# Patient Record
Sex: Male | Born: 1975 | Hispanic: No | Marital: Married | Smoking: Never smoker
Health system: Southern US, Community
[De-identification: ages and names within clinical notes are randomized; demographics above are authoritative.]

---

## 2016-05-01 ENCOUNTER — Emergency Department (HOSPITAL_BASED_OUTPATIENT_CLINIC_OR_DEPARTMENT_OTHER)
Admission: EM | Admit: 2016-05-01 | Discharge: 2016-05-01 | Disposition: A | Discharge status: Home / Self Care | Payer: BLUE CROSS/BLUE SHIELD | Attending: Emergency Medicine | Admitting: Emergency Medicine

## 2016-05-01 ENCOUNTER — Emergency Department (HOSPITAL_BASED_OUTPATIENT_CLINIC_OR_DEPARTMENT_OTHER): Payer: BLUE CROSS/BLUE SHIELD

## 2016-05-01 ENCOUNTER — Encounter (HOSPITAL_BASED_OUTPATIENT_CLINIC_OR_DEPARTMENT_OTHER): Payer: Self-pay

## 2016-05-01 DIAGNOSIS — W19XXXA Unspecified fall, initial encounter: Secondary | ICD-10-CM | POA: Insufficient documentation

## 2016-05-01 DIAGNOSIS — S60221A Contusion of right hand, initial encounter: Secondary | ICD-10-CM | POA: Diagnosis not present

## 2016-05-01 DIAGNOSIS — Y939 Activity, unspecified: Secondary | ICD-10-CM | POA: Insufficient documentation

## 2016-05-01 DIAGNOSIS — Y999 Unspecified external cause status: Secondary | ICD-10-CM | POA: Insufficient documentation

## 2016-05-01 DIAGNOSIS — Y929 Unspecified place or not applicable: Secondary | ICD-10-CM | POA: Diagnosis not present

## 2016-05-01 DIAGNOSIS — S6991XA Unspecified injury of right wrist, hand and finger(s), initial encounter: Secondary | ICD-10-CM | POA: Diagnosis present

## 2016-05-01 DIAGNOSIS — S6000XA Contusion of unspecified finger without damage to nail, initial encounter: Secondary | ICD-10-CM

## 2016-05-01 NOTE — Discharge Instructions (Signed)
Rest, Ice intermittently (in the first 24-48 hours), Gentle compression with an Ace wrap, and elevate (Limb above the level of the heart) °  °Take up to 800mg of ibuprofen (that is usually 4 over the counter pills)  3 times a day for 5 days. Take with food. ° °Please follow with your primary care doctor in the next 2 days for a check-up. They must obtain records for further management.  ° °Do not hesitate to return to the Emergency Department for any new, worsening or concerning symptoms.  ° °

## 2016-05-01 NOTE — ED Notes (Signed)
ED Provider at bedside. 

## 2016-05-01 NOTE — ED Provider Notes (Signed)
[facility] Note   CSN: [id number] Arrival date & time: 05/01/16  1208     History   Chief Complaint Chief Complaint  Patient presents with  . Hand Injury    HPI   Blood pressure 125/81, pulse 93, temperature 97.8 F (36.6 C), temperature source Oral, resp. rate 18, SpO2 100 %.  Joshua Herring is a 41 y.o. male complaining of right hand pain status post mechanical fall 2 weeks ago. He's been taking ibuprofen intermittently with little relief. He is right-hand-dominant. States pain is exacerbated by movement and palpation. No prior traumas or surgeries to the affected joint. He denies any wrist injury or pain. States that the pain is worst in between the second and third digits on the distal metacarpals.  History reviewed. No pertinent past medical history.  There are no active problems to display for this patient.   History reviewed. No pertinent surgical history.     Home Medications    Prior to Admission medications   Not on File    Family History No family history on file.  Social History Social History  Substance Use Topics  . Smoking status: Never Smoker  . Smokeless tobacco: Never Used  . Alcohol use No     Allergies   Patient has no known allergies.   Review of Systems Review of Systems  10 systems reviewed and found to be negative, except as noted in the HPI.   Physical Exam Updated Vital Signs BP 125/81 (BP Location: Left Arm)   Pulse 93   Temp 97.8 F (36.6 C) (Oral)   Resp 18   SpO2 100%   Physical Exam  Constitutional: He is oriented to person, place, and time. He appears well-developed and well-nourished. No distress.  HENT:  Head: Normocephalic and atraumatic.  Mouth/Throat: Oropharynx is clear and moist.  Eyes: Conjunctivae and EOM are normal. Pupils are equal, round, and reactive to light.  Neck: Normal range of motion.  Cardiovascular: Normal rate, regular rhythm and intact distal pulses.   Pulmonary/Chest:  Effort normal and breath sounds normal.  Abdominal: Soft. There is no tenderness.  Musculoskeletal: Normal range of motion. He exhibits tenderness. He exhibits no edema.  No deformity or ecchymoses or overlying skin changes for range of motion with grip strength 5 out of 5, full strength and sensation. He is tender to palpation along the dorsal aspect in between the second and third digit. No snuffbox tenderness to palpation.  Neurological: He is alert and oriented to person, place, and time.  Skin: He is not diaphoretic.  Psychiatric: He has a normal mood and affect.  Nursing note and vitals reviewed.    ED Treatments / Results  Labs (all labs ordered are listed, but only abnormal results are displayed) Labs Reviewed - No data to display  EKG  EKG Interpretation None       Radiology Dg Hand Complete Right  Result Date: 05/01/2016 CLINICAL DATA:  Bending injury 1 month ago with pain at the second MCP joint. EXAM: RIGHT HAND - COMPLETE 3+ VIEW COMPARISON:  None. FINDINGS: Alignment of the right hand is normal. Negative for a fracture or dislocation. Soft tissues are unremarkable. IMPRESSION: Negative. Electronically Signed   By: Richarda OverlieAdam  Henn M.D.   On: 05/01/2016 12:50    Procedures Procedures (including critical care time)  Medications Ordered in ED Medications - No data to display   Initial Impression / Assessment and Plan / ED Course  I have reviewed the triage vital  signs and the nursing notes.  Pertinent labs & imaging results that were available during my care of the patient were reviewed by me and considered in my medical decision making (see chart for details).     Vitals:   05/01/16 1225  BP: 125/81  Pulse: 93  Resp: 18  Temp: 97.8 F (36.6 C)  TempSrc: Oral  SpO2: 100%    Medications - No data to display  Joshua Herring is 41 y.o. male presenting with Right hand pain status post fall 2 weeks ago there is no wrist pain x-ray negative, physical exam with no  abnormality, advised him to follow closely with primary care rest, ice, compression elevation.  Evaluation does not show pathology that would require ongoing emergent intervention or inpatient treatment. Pt is hemodynamically stable and mentating appropriately. Discussed findings and plan with patient/guardian, who agrees with care plan. All questions answered. Return precautions discussed and outpatient follow up given.      Final Clinical Impressions(s) / ED Diagnoses   Final diagnoses:  Contusion of finger of right hand, unspecified finger, initial encounter    New Prescriptions New Prescriptions   No medications on file     Joshua Emery, PA-C 05/01/16 1330    [address] PA-C 05/01/16 1332    Laurence Spates, MD 05/01/16 [phone]

## 2016-05-01 NOTE — ED Triage Notes (Signed)
Pt states he fell 3-4 weeks ago-no break in skin/bruising or swelling noted-pain to right hand-NAD-steady gait

## 2016-05-01 NOTE — ED Notes (Signed)
Patient transported to X-ray 

## 2018-05-12 IMAGING — CR DG HAND COMPLETE 3+V*R*
3 series · 3 of 3 positions shown · non-contrast
Comparison: None.

CLINICAL DATA: Bending injury 1 month ago with pain at the second
MCP joint.

EXAM:
RIGHT HAND - COMPLETE 3+ VIEW

[x hand pa right]
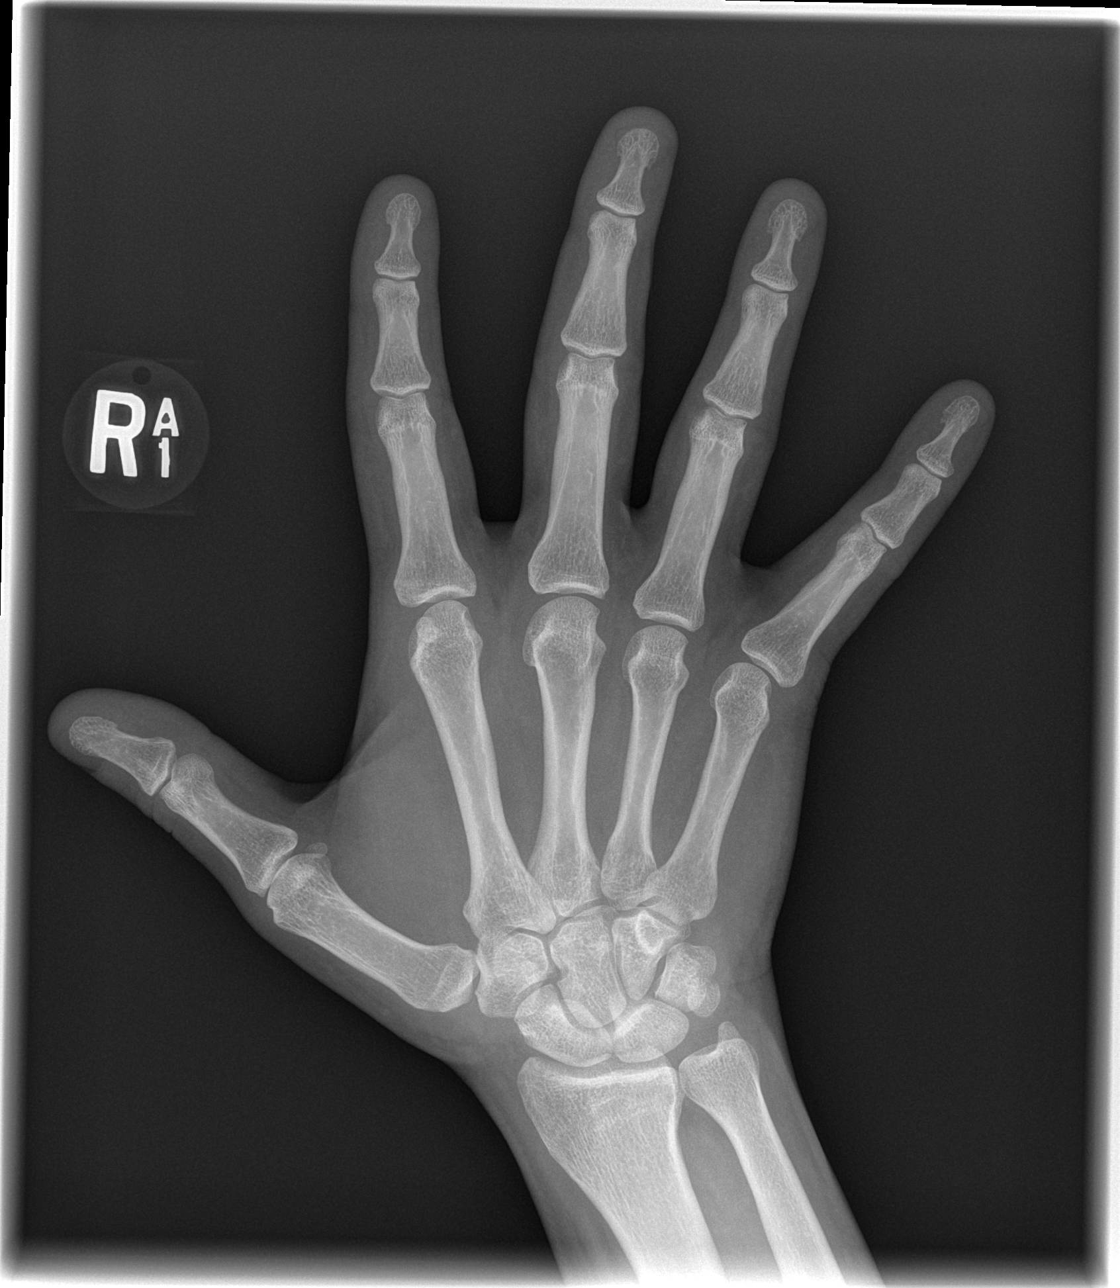

[x hand oblique right]
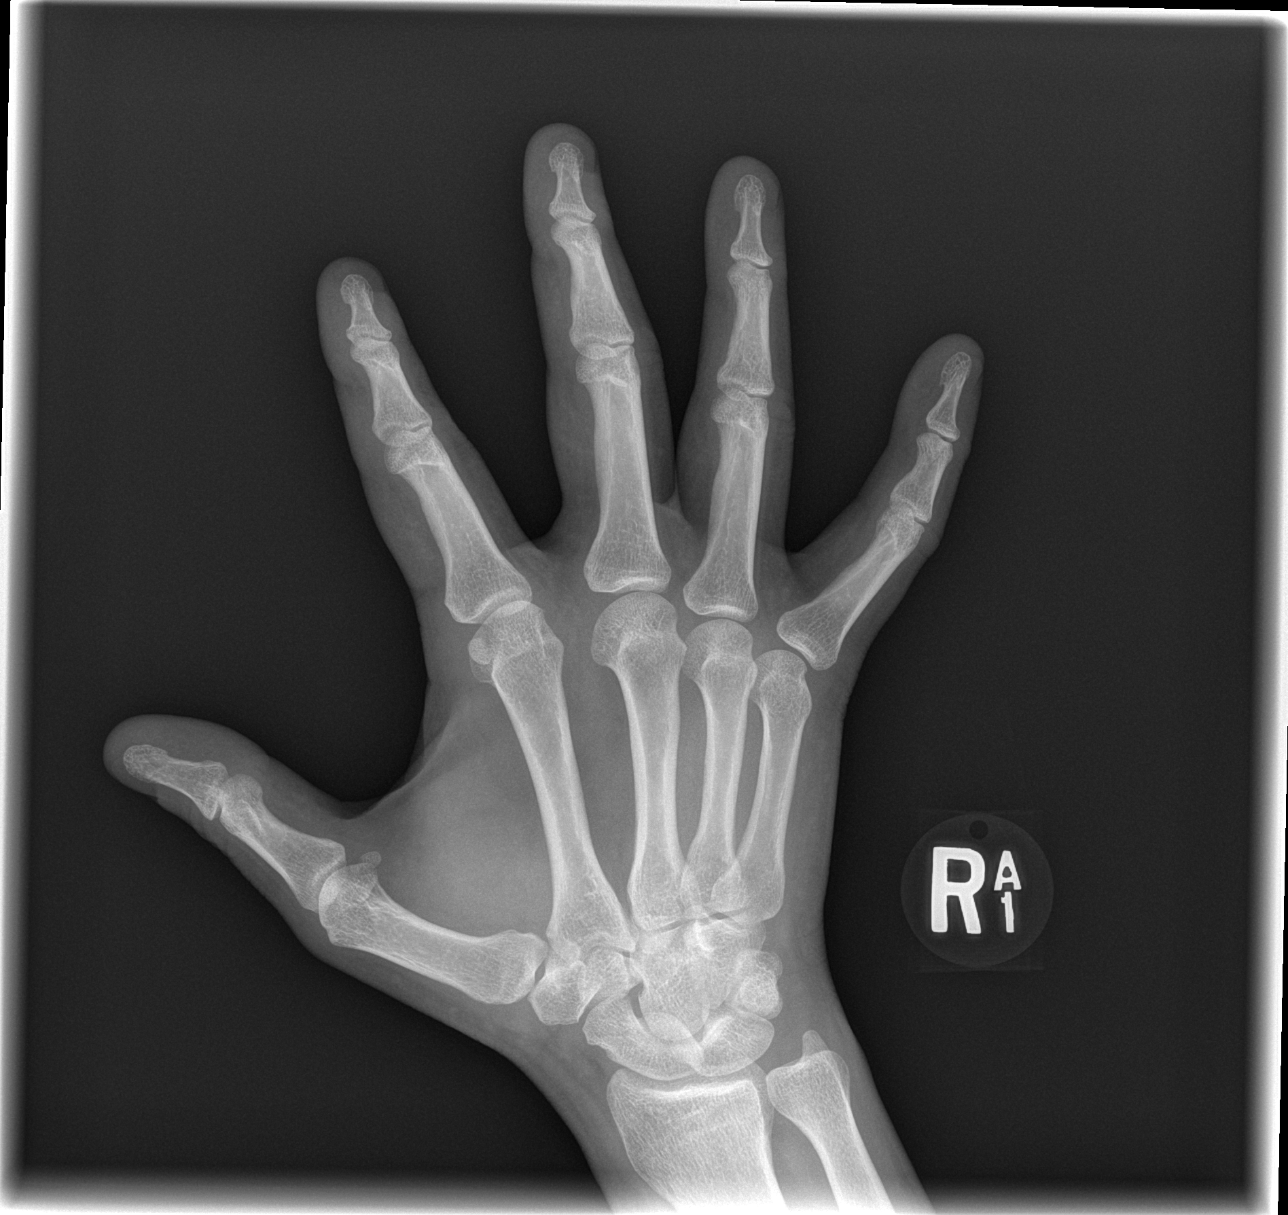

[x hand lat right]
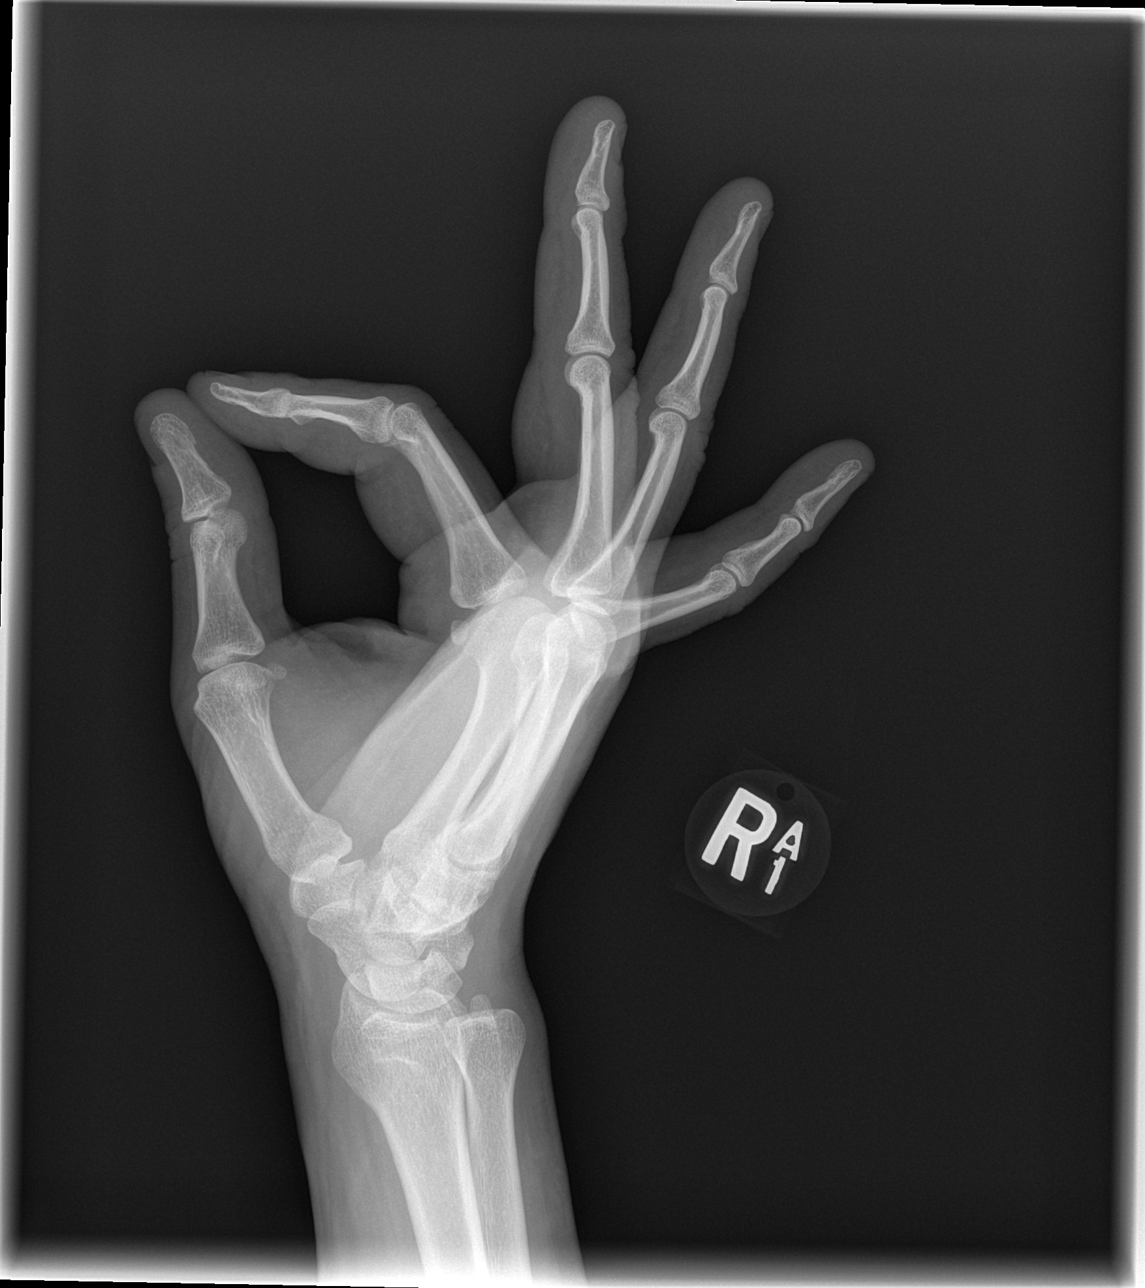

[3 of 3 positions shown; findings below may reference images not displayed]

FINDINGS: Alignment of the right hand is normal. Negative for a fracture or
dislocation. Soft tissues are unremarkable.
IMPRESSION: Negative.
